# Patient Record
Sex: Female | Born: 1955 | Race: White | Hispanic: Yes | Marital: Single | Smoking: Never smoker
Health system: Southern US, Community
[De-identification: ages and names within clinical notes are randomized; demographics above are authoritative.]

---

## 2009-04-01 ENCOUNTER — Ambulatory Visit: Payer: Self-pay

## 2018-07-07 ENCOUNTER — Other Ambulatory Visit: Payer: Self-pay | Admitting: Physician Assistant

## 2018-07-07 DIAGNOSIS — Z1231 Encounter for screening mammogram for malignant neoplasm of breast: Secondary | ICD-10-CM

## 2019-02-28 ENCOUNTER — Ambulatory Visit: Payer: Self-pay

## 2019-07-16 ENCOUNTER — Other Ambulatory Visit: Payer: Self-pay

## 2019-07-23 ENCOUNTER — Other Ambulatory Visit: Payer: Self-pay

## 2019-07-24 ENCOUNTER — Ambulatory Visit: Payer: Self-pay | Attending: Oncology

## 2019-07-24 ENCOUNTER — Other Ambulatory Visit: Payer: Self-pay

## 2019-07-24 VITALS — BP 151/84 | HR 72 | Temp 98.0°F | Ht 61.0 in | Wt 168.9 lb

## 2019-07-24 DIAGNOSIS — Z0000 Encounter for general adult medical examination without abnormal findings: Secondary | ICD-10-CM

## 2019-07-24 NOTE — Progress Notes (Signed)
  Subjective:     Patient ID: Christina Barker, female   DOB: May 22, 1956, 63 y.o.   MRN: [id number]  HPI   Review of Systems     Objective:   Physical Exam Chest:     Breasts:        Right: No swelling, bleeding, inverted nipple, mass, nipple discharge, skin change or tenderness.        Left: No swelling, bleeding, inverted nipple, mass, nipple discharge, skin change or tenderness.        Assessment:   63 year old hispanic patient presents for BCCCP clinic visit.  Jaqui Laukaitis interpreted exam. Patient screened, and meets BCCCP eligibility.  Patient does not have insurance, Medicare or Medicaid. Instructed patient on breast self awareness using teach back method.  Clinical breast exam unremarkable.  No mass or lump.  Patient did receive GIA request but it was in [place] so she did not complete.    Plan:     Sent for bilateral screening mammogram.

## 2019-12-17 ENCOUNTER — Telehealth: Payer: Self-pay

## 2019-12-17 NOTE — Telephone Encounter (Signed)
2 attempts made to contact pt regarding BCCCP appointment using interpreter: Steward Drone # [phone].

## 2019-12-18 NOTE — Progress Notes (Signed)
Due to Covid 19 pandemic a televisit was used to enroll patient into our BCCCP program and complete her health history.  Louis # [id number] at [facility] [employer] was used for interpretation.  2 identifiers were used to ensure I was speaking to the correct person.  Verbal consent given.  Patient denies any breast problems.  Last pap on 07/05/18 was negative / negative.  Next pap due in 2024.  Patient was seen by Coralee Rud, RN in our BCCCP program on 07/24/19 but did not get her mammogram.  Patient states "I couldn't find the clinic."  Address given to patient for the [facility], as well as verbal directions.  Patient has been screened for eligibility.  She does not have any insurance, Medicare or Medicaid.  She also meets financial eligibility.  See Dayna Barker for breast risk assessment.  Screening mammogram ordered.  Will follow up per BCCCP protocol.  Risk Assessment    Risk Scores      12/19/2019 07/24/2019   Last edited by: Jim Like, RN Jim Like, RN   5-year risk: 0.7 % 0.7 %   Lifetime risk: 3.1 % 3.1 %

## 2019-12-19 ENCOUNTER — Other Ambulatory Visit: Payer: Self-pay

## 2019-12-19 ENCOUNTER — Encounter: Payer: Self-pay | Admitting: *Deleted

## 2019-12-19 ENCOUNTER — Ambulatory Visit: Payer: Self-pay | Attending: Oncology | Admitting: *Deleted

## 2019-12-19 ENCOUNTER — Ambulatory Visit
Admission: RE | Admit: 2019-12-19 | Discharge: 2019-12-19 | Disposition: A | Discharge status: Home / Self Care | Payer: Self-pay | Source: Ambulatory Visit | Attending: Oncology | Admitting: Oncology

## 2019-12-19 ENCOUNTER — Other Ambulatory Visit: Payer: Self-pay | Admitting: *Deleted

## 2019-12-19 DIAGNOSIS — Z0000 Encounter for general adult medical examination without abnormal findings: Secondary | ICD-10-CM

## 2019-12-19 DIAGNOSIS — N63 Unspecified lump in unspecified breast: Secondary | ICD-10-CM

## 2019-12-27 ENCOUNTER — Ambulatory Visit
Admission: RE | Admit: 2019-12-27 | Discharge: 2019-12-27 | Disposition: A | Discharge status: Home / Self Care | Payer: Self-pay | Source: Ambulatory Visit | Attending: Oncology | Admitting: Oncology

## 2019-12-27 DIAGNOSIS — N63 Unspecified lump in unspecified breast: Secondary | ICD-10-CM

## 2020-02-05 ENCOUNTER — Ambulatory Visit
Admission: RE | Admit: 2020-02-05 | Discharge: 2020-02-05 | Disposition: A | Discharge status: Home / Self Care | Payer: Self-pay | Source: Ambulatory Visit | Attending: Oncology | Admitting: Oncology

## 2020-02-05 DIAGNOSIS — N63 Unspecified lump in unspecified breast: Secondary | ICD-10-CM | POA: Insufficient documentation

## 2020-02-07 ENCOUNTER — Encounter: Payer: Self-pay | Admitting: *Deleted

## 2020-02-07 NOTE — Progress Notes (Signed)
Letter mailed from the [facility] to inform patient of her normal mammogram results.  Patient is to follow-up with annual screening in one year. 

## 2021-12-25 ENCOUNTER — Encounter: Payer: Self-pay | Admitting: Emergency Medicine

## 2021-12-25 ENCOUNTER — Ambulatory Visit (INDEPENDENT_AMBULATORY_CARE_PROVIDER_SITE_OTHER): Payer: Self-pay

## 2021-12-25 ENCOUNTER — Other Ambulatory Visit: Payer: Self-pay

## 2021-12-25 ENCOUNTER — Ambulatory Visit
Admission: EM | Admit: 2021-12-25 | Discharge: 2021-12-25 | Disposition: A | Discharge status: Home / Self Care | Payer: Self-pay | Attending: Emergency Medicine | Admitting: Emergency Medicine

## 2021-12-25 DIAGNOSIS — S52611A Displaced fracture of right ulna styloid process, initial encounter for closed fracture: Secondary | ICD-10-CM

## 2021-12-25 DIAGNOSIS — S52571A Other intraarticular fracture of lower end of right radius, initial encounter for closed fracture: Secondary | ICD-10-CM

## 2021-12-25 MED ORDER — TRAMADOL HCL 50 MG PO TABS: 50.0000 mg | ORAL_TABLET | Freq: Four times a day (QID) | ORAL | 0 refills | Status: AC | PRN

## 2021-12-25 NOTE — Discharge Instructions (Addendum)
Take the tramadol as needed for pain.  Do not drive, operate machinery, or drink alcohol with this medication as it may cause drowsiness.   Rest and elevate your wrist.  Apply ice packs 2-3 times a day for up to 20 minutes each.  Wear the wrist splint and sling.     Follow up with an orthopedist tomorrow.  Be sure to ask for the hand specialist when you schedule the appointment.

## 2021-12-25 NOTE — ED Provider Notes (Signed)
[name]    CSN: [id number] Arrival date & time: 12/25/21  1809      History   Chief Complaint Chief Complaint  Patient presents with   Fall   Wrist Pain    HPI Christina Barker is a 66 y.o. female.  Accompanied by her granddaughter, patient presents with pain and swelling of her right wrist after she fell today.  She slipped and fell while carrying a pot.  The pain is worse with movement. She cannot move her wrist without acute pain.  No other injury.  No loss of consciousness.  She denies open wounds, redness, bruising, numbness, paresthesias, or other symptoms.  No treatments at home.  Patient denies pertinent medical history.  The history is provided by the patient and a relative. A language interpreter was used.   History reviewed. No pertinent past medical history.  There are no problems to display for this patient.   History reviewed. No pertinent surgical history.  OB History   No obstetric history on file.      Home Medications    Prior to Admission medications   Medication Sig Start Date End Date Taking? Authorizing Provider  traMADol (ULTRAM) 50 MG tablet Take 1 tablet (50 mg total) by mouth every 6 (six) hours as needed for up to 5 days. 12/25/21 12/30/21 Yes Mickie Bail, NP    Family History Family History  Problem Relation Age of Onset   Breast cancer Neg Hx     Social History Social History   Tobacco Use   Smoking status: Never   Smokeless tobacco: Never  Substance Use Topics   Alcohol use: Never   Drug use: Never     Allergies   Patient has no known allergies.   Review of Systems Review of Systems  Musculoskeletal:  Positive for arthralgias and joint swelling.  Skin:  Negative for color change, rash and wound.  Neurological:  Negative for weakness and numbness.  All other systems reviewed and are negative.   Physical Exam Triage Vital Signs ED Triage Vitals [12/25/21 1825]  Enc Vitals Group     BP (!) 164/93      Pulse Rate 84     Resp 18     Temp 98.4 F (36.9 C)     Temp Source Oral     SpO2 97 %     Weight      Height      Head Circumference      Peak Flow      Pain Score      Pain Loc      Pain Edu?      Excl. in GC?    No data found.  Updated Vital Signs BP (!) 164/93 (BP Location: Left Arm)    Pulse 84    Temp 98.4 F (36.9 C) (Oral)    Resp 18    SpO2 97%   Visual Acuity Right Eye Distance:   Left Eye Distance:   Bilateral Distance:    Right Eye Near:   Left Eye Near:    Bilateral Near:     Physical Exam Vitals and nursing note reviewed.  Constitutional:      General: She is not in acute distress.    Appearance: She is well-developed.  HENT:     Mouth/Throat:     Mouth: Mucous membranes are moist.  Cardiovascular:     Rate and Rhythm: Normal rate and regular rhythm.     Heart sounds:  Normal heart sounds.  Pulmonary:     Effort: Pulmonary effort is normal. No respiratory distress.     Breath sounds: Normal breath sounds.  Musculoskeletal:        General: Swelling, tenderness and deformity present.       Arms:     Cervical back: Neck supple.  Skin:    General: Skin is warm and dry.     Capillary Refill: Capillary refill takes less than 2 seconds.     Findings: No bruising, erythema, lesion or rash.  Neurological:     General: No focal deficit present.     Mental Status: She is alert and oriented to person, place, and time.     Sensory: No sensory deficit.     Motor: No weakness.  Psychiatric:        Mood and Affect: Mood normal.        Behavior: Behavior normal.     UC Treatments / Results  Labs (all labs ordered are listed, but only abnormal results are displayed) Labs Reviewed - No data to display  EKG   Radiology DG Wrist Complete Right  Result Date: 12/25/2021 CLINICAL DATA:  Right wrist pain after fall down stairs. EXAM: RIGHT WRIST - COMPLETE 3+ VIEW COMPARISON:  None. FINDINGS: Severely displaced and comminuted fracture is seen  involving the distal right radius with intra-articular extension. Mildly displaced ulnar styloid fracture is noted. IMPRESSION: Severely displaced and comminuted distal right radial fracture with intra-articular extension. Mildly displaced ulnar styloid fracture. Electronically Signed   By: Lupita Raider M.D.   On: 12/25/2021 18:44    Procedures Procedures (including critical care time)  Medications Ordered in UC Medications - No data to display  Initial Impression / Assessment and Plan / UC Course  I have reviewed the triage vital signs and the nursing notes.  Pertinent labs & imaging results that were available during my care of the patient were reviewed by me and considered in my medical decision making (see chart for details).   Fracture of right radius and ulna.  Xray shows " Severely displaced and comminuted distal right radial fracture with intra-articular extension. Mildly displaced ulnar styloid fracture."  Telephone consult with Dr. Loralie Champagne, orthopedist on call and he gave instructions to apply sugartong splint and have patient follow up in his office with hand specialist at the soonest available appointment.  Treating pain with tramadol as needed; precautions for drowsiness with this medication discussed.  Discussed OTC ibuprofen if pain is mild.  Her granddaughter will spend the night with her tonight.  Discussed rest, ice packs, elevation. Wear splint and sling.  Instructed her to call ortho tomorrow to schedule appointment.  Patient and her granddaughter agree to plan of care.     Final Clinical Impressions(s) / UC Diagnoses   Final diagnoses:  Other closed intra-articular fracture of distal end of right radius, initial encounter  Closed displaced fracture of styloid process of right ulna, initial encounter     Discharge Instructions      Take the tramadol as needed for pain.  Do not drive, operate machinery, or drink alcohol with this medication as it may cause drowsiness.    Rest and elevate your wrist.  Apply ice packs 2-3 times a day for up to 20 minutes each.  Wear the wrist splint and sling.     Follow up with an orthopedist tomorrow.  Be sure to ask for the hand specialist when you schedule the appointment.  ED Prescriptions     Medication Sig Dispense Auth. Provider   traMADol (ULTRAM) 50 MG tablet Take 1 tablet (50 mg total) by mouth every 6 (six) hours as needed for up to 5 days. 20 tablet Mickie Bailate, Yamilka Lopiccolo H, NP      I have reviewed the PDMP during this encounter.   Mickie Bailate, Beatrice Sehgal H, NP 12/25/21 1940

## 2021-12-25 NOTE — ED Triage Notes (Signed)
Pt here post mechanical fall with pain, swelling, limited ROM and deformity to right wrist.

## 2022-08-11 IMAGING — DX DG WRIST COMPLETE 3+V*R*
4 series · 4 of 4 positions shown · non-contrast
Comparison: None.

CLINICAL DATA: Right wrist pain after fall down stairs.

EXAM:
RIGHT WRIST - COMPLETE 3+ VIEW

[wrist pa (1 of 2)]
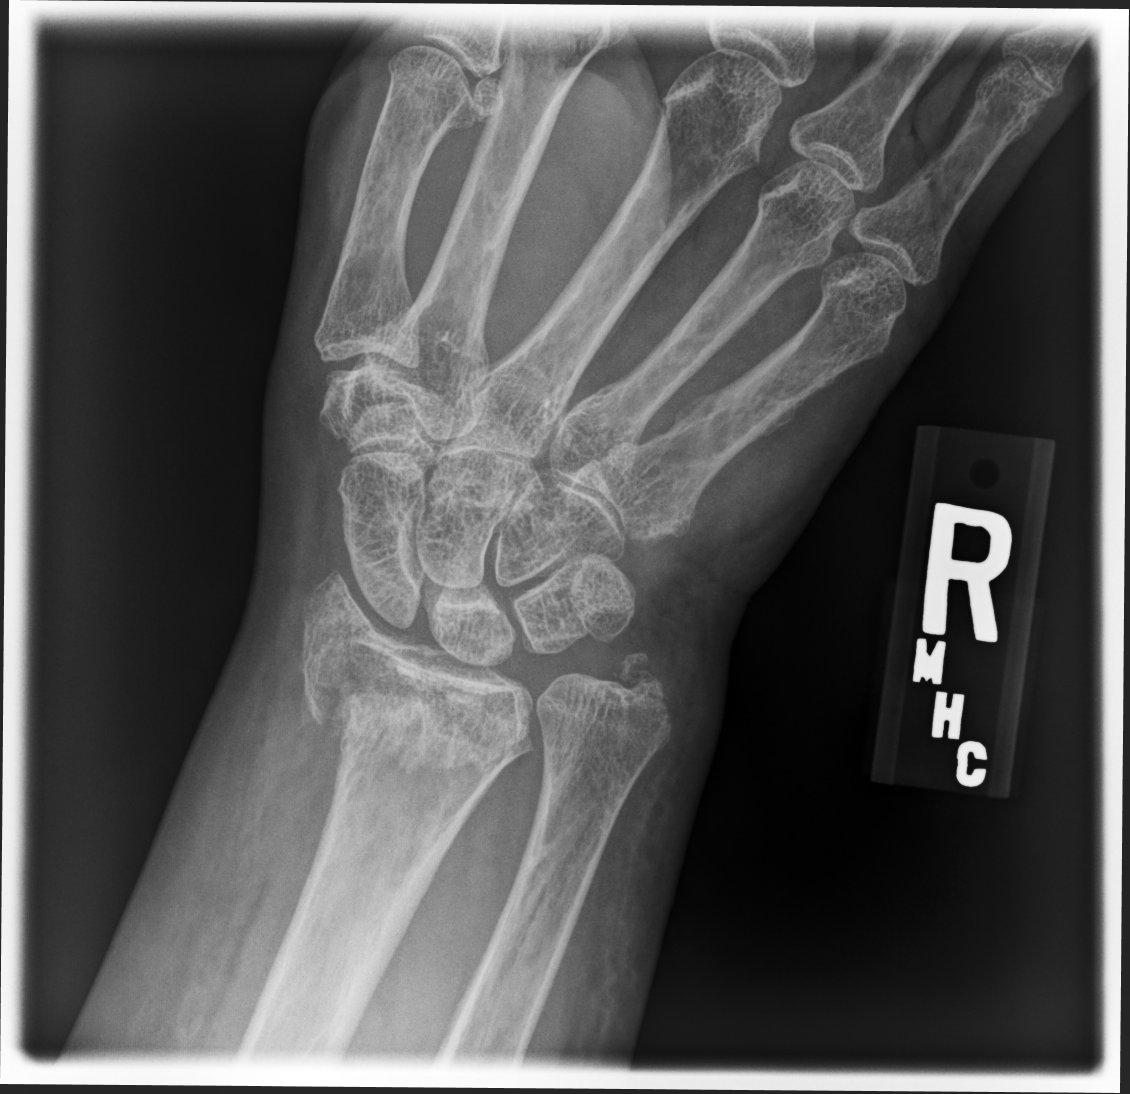

[wrist pa (2 of 2)]
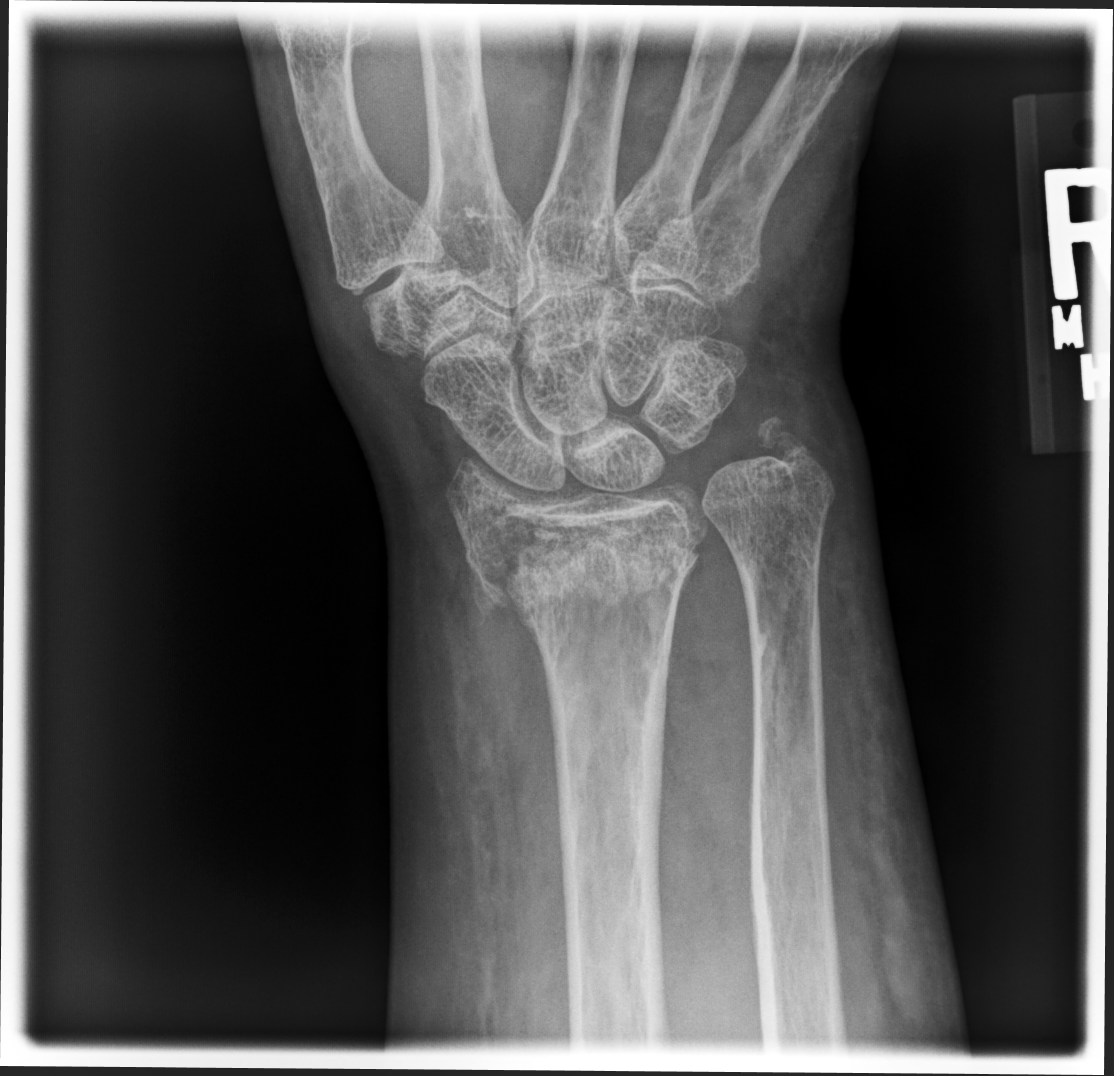

[wrist mlo]
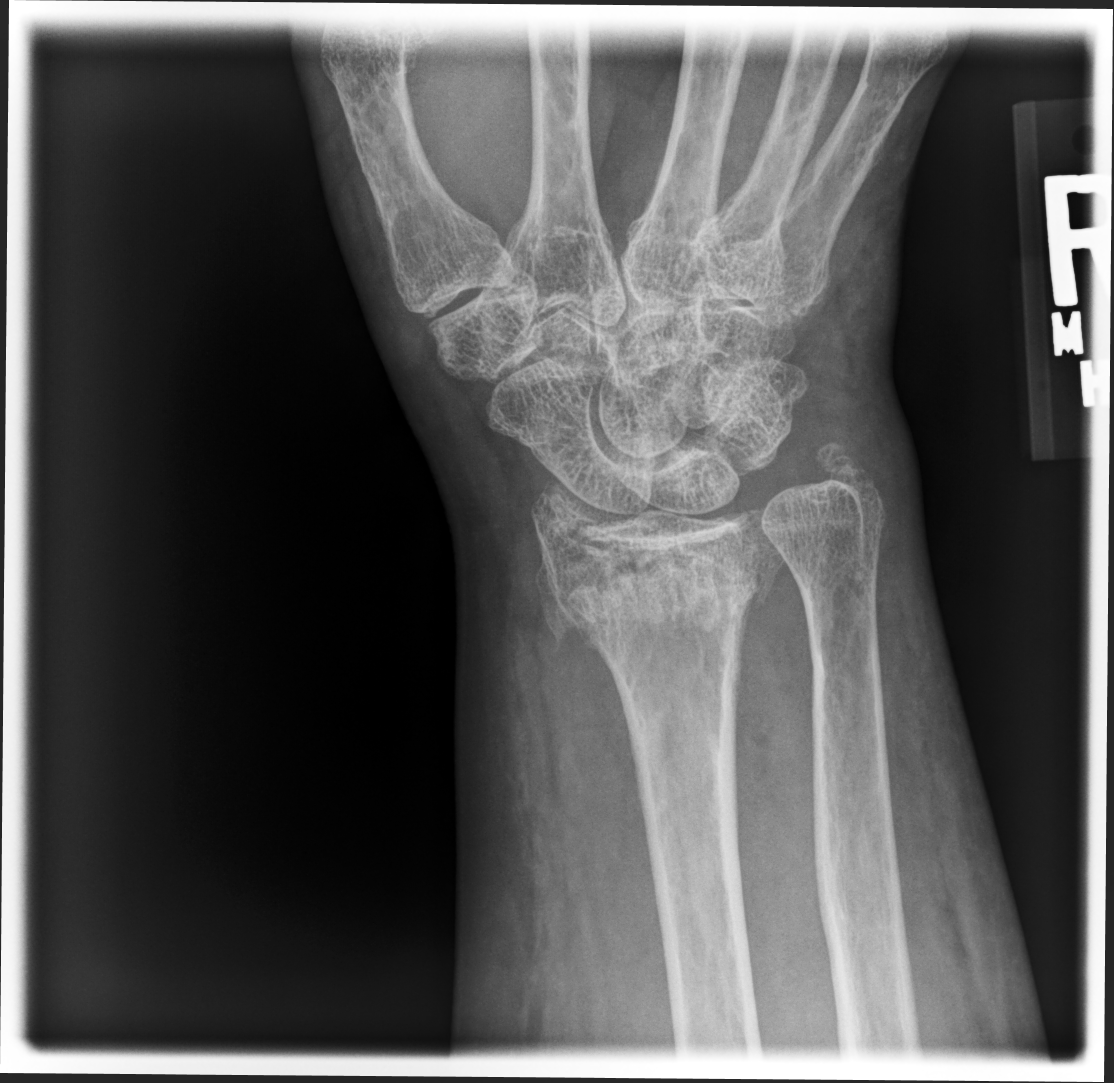

[wrist lat]
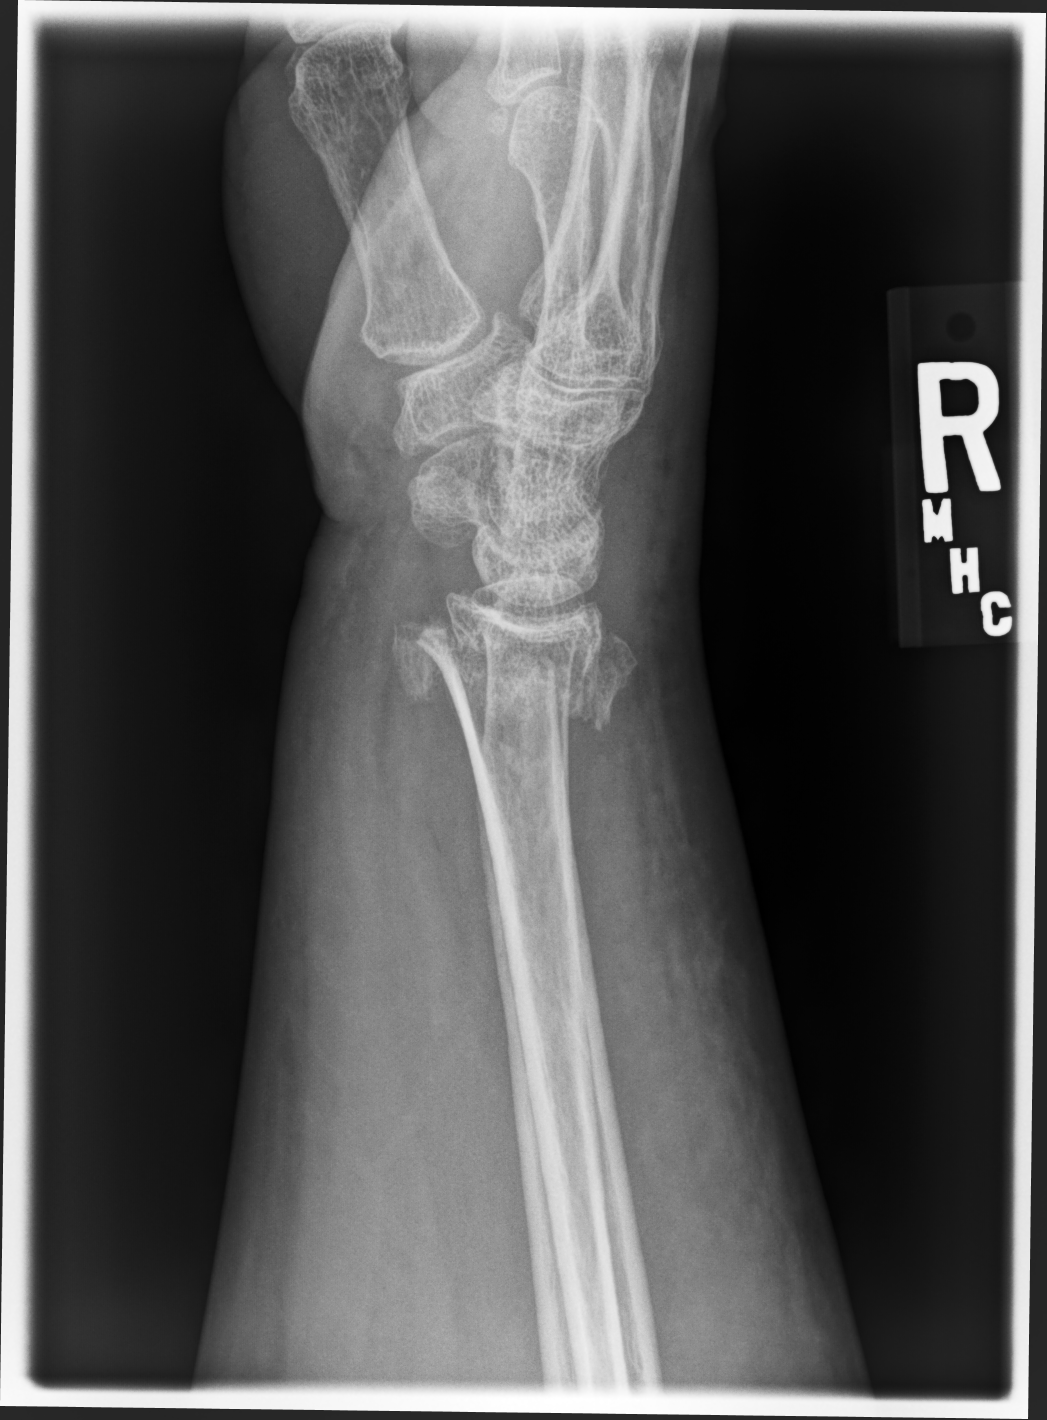

[4 of 4 positions shown; findings below may reference images not displayed]

FINDINGS: Severely displaced and comminuted fracture is seen involving the
distal right radius with intra-articular extension. Mildly displaced
ulnar styloid fracture is noted.
IMPRESSION: Severely displaced and comminuted distal right radial fracture with
intra-articular extension. Mildly displaced ulnar styloid fracture.

## 2023-02-21 ENCOUNTER — Ambulatory Visit: Payer: Self-pay

## 2023-03-15 ENCOUNTER — Ambulatory Visit: Payer: Self-pay | Attending: Hematology and Oncology | Admitting: Hematology and Oncology

## 2023-03-15 ENCOUNTER — Ambulatory Visit
Admission: RE | Admit: 2023-03-15 | Discharge: 2023-03-15 | Disposition: A | Discharge status: Home / Self Care | Payer: Self-pay | Source: Ambulatory Visit | Attending: Obstetrics and Gynecology | Admitting: Obstetrics and Gynecology

## 2023-03-15 ENCOUNTER — Other Ambulatory Visit: Payer: Self-pay | Admitting: Obstetrics and Gynecology

## 2023-03-15 VITALS — BP 149/80 | Wt 164.0 lb

## 2023-03-15 DIAGNOSIS — Z1231 Encounter for screening mammogram for malignant neoplasm of breast: Secondary | ICD-10-CM

## 2023-03-15 NOTE — Patient Instructions (Signed)
Taught Christina Barker about self breast awareness and gave educational materials to take home. Patient did not need a Pap smear today due to last Pap smear was in 2019 per patient. Patient is now 61 and will no longer need Pap smears. Referred patient to the [facility] for diagnostic mammogram. Appointment scheduled for 03/15/23. Patient aware of appointment and will be there. Let patient know will follow up with her within the next couple weeks with results. Christina Barker verbalized understanding.  Pascal Lux, NP 8:23 AM

## 2023-03-15 NOTE — Progress Notes (Signed)
Ms. Eboney Claybrook is a 67 y.o. female who presents to [facility] clinic today with no complaints.    Pap Smear: Pap not smear completed today. Last Pap smear was 2019 at [facility] clinic and was normal. Per patient has no history of an abnormal Pap smear. Last Pap smear result is available in Epic. As she has had normal pap smears and is 67 years old, she will no longer need Pap smears.    Physical exam: Breasts Breasts symmetrical. No skin abnormalities bilateral breasts. No nipple retraction bilateral breasts. No nipple discharge bilateral breasts. No lymphadenopathy. No lumps palpated bilateral breasts.  MS DIGITAL SCREENING TOMO BILATERAL  Result Date: 12/19/2019 CLINICAL DATA:  Screening. EXAM: DIGITAL SCREENING BILATERAL MAMMOGRAM WITH TOMO AND CAD COMPARISON:  Previous exam(s). ACR Breast Density Category b: There are scattered areas of fibroglandular density. FINDINGS: In the left breast, a possible mass warrants further evaluation. In the right breast, no findings suspicious for malignancy. Images were processed with CAD. IMPRESSION: Further evaluation is suggested for possible mass in the left breast. RECOMMENDATION: Ultrasound of the left breast. (Code:US-L-22M) The patient will be contacted regarding the findings, and additional imaging will be scheduled. BI-RADS CATEGORY  0: Incomplete. Need additional imaging evaluation and/or prior mammograms for comparison. Electronically Signed   By: Amie Portland M.D.   On: 12/19/2019 15:26         Pelvic/Bimanual Pap is not indicated today    Smoking History: Patient has never smoked and was not referred to quit line.    Patient Navigation: Patient education provided. Access to services provided for patient through [facility] program. No interpreter provided. No transportation provided   Colorectal Cancer Screening: Per patient has never had colonoscopy completed No complaints today. FIT test given per Phineas Real   Breast and Cervical  Cancer Risk Assessment: Patient does not have family history of breast cancer, known genetic mutations, or radiation treatment to the chest before age 3. Patient does not have history of cervical dysplasia, immunocompromised, or DES exposure in-utero.  Risk Assessment   No risk assessment data for the current encounter  Risk Scores       12/19/2019   Last edited by: Jim Like, RN   5-year risk: 0.7 %   Lifetime risk: 3.1 %            A: BCCCP exam without pap smear No complaints with benign exam.   P: [facility] to the [facility] for a screening mammogram. Appointment scheduled 03/15/23.  Ilda Basset A, NP 03/15/2023 8:21 AM

## 2023-07-01 ENCOUNTER — Telehealth: Payer: Self-pay

## 2023-07-01 NOTE — Telephone Encounter (Signed)
Via, Rito Ehrlich, [facility] [employer], Patient was contacted and informed needs to cancel appointment for pap smear on 07/05/2023, as BCCCP does not cover after age 67, needs to contact her pcp, Dr. Danie Chandler @ [facility]. Patient verbalized understanding.

## 2023-07-05 ENCOUNTER — Ambulatory Visit: Payer: Self-pay
